# Patient Record
Sex: Female | Born: 1935 | Race: White | Hispanic: No | Smoking: Never smoker
Health system: Southern US, Community
[De-identification: ages and names within clinical notes are randomized; demographics above are authoritative.]

## PROBLEM LIST (undated history)

## (undated) DIAGNOSIS — I639 Cerebral infarction, unspecified: Secondary | ICD-10-CM

## (undated) DIAGNOSIS — G510 Bell's palsy: Secondary | ICD-10-CM

## (undated) DIAGNOSIS — I10 Essential (primary) hypertension: Secondary | ICD-10-CM

## (undated) DIAGNOSIS — E7800 Pure hypercholesterolemia, unspecified: Secondary | ICD-10-CM

## (undated) HISTORY — PX: CHOLECYSTECTOMY: SHX55

---

## 2012-07-25 ENCOUNTER — Ambulatory Visit (INDEPENDENT_AMBULATORY_CARE_PROVIDER_SITE_OTHER): Payer: PRIVATE HEALTH INSURANCE | Admitting: Emergency Medicine

## 2012-07-25 ENCOUNTER — Ambulatory Visit: Payer: Self-pay

## 2012-07-25 VITALS — BP 118/76 | HR 68 | Temp 98.7°F | Resp 16 | Ht 60.0 in | Wt 129.8 lb

## 2012-07-25 DIAGNOSIS — R05 Cough: Secondary | ICD-10-CM

## 2012-07-25 DIAGNOSIS — R059 Cough, unspecified: Secondary | ICD-10-CM

## 2012-07-25 DIAGNOSIS — J209 Acute bronchitis, unspecified: Secondary | ICD-10-CM

## 2012-07-25 MED ORDER — LEVOFLOXACIN 500 MG PO TABS: 500.0000 mg | ORAL_TABLET | Freq: Every day | ORAL | Status: AC

## 2012-07-25 MED ORDER — ALBUTEROL SULFATE HFA 108 (90 BASE) MCG/ACT IN AERS: 2.0000 | INHALATION_SPRAY | RESPIRATORY_TRACT | Status: AC | PRN

## 2012-07-25 MED ORDER — SPACER/AERO CHAMBER MOUTHPIECE MISC: 1.0000 | Status: AC

## 2012-07-25 MED ORDER — HYDROCOD POLST-CHLORPHEN POLST 10-8 MG/5ML PO LQCR
5.0000 mL | Freq: Two times a day (BID) | ORAL | Status: AC | PRN
Start: 2012-07-25 — End: ?

## 2012-07-25 NOTE — Patient Instructions (Addendum)
Bronquite  (Bronchitis)  A bronquite é uma inflamação (a forma que o corpo tem de reagir a um dano e/ou uma infecção) dos brônquios. Os brônquios são os tubos de ar que levam ar aos pulmões. Se a inflamação se tornar séria, pode causar falta de ar.  CAUSAS  A inflamação pode ser causada por:  · Vírus.  · Germes (bactérias).  · Poeira.  · Alérgenos.  · Poluentes e vários outros fatores irritantes.  As células que revestem a árvore brônquica são cobertas de cílios. Esses cílios se movimentam constantemente dos pulmões em direção à boca. Isso mantêm o pulmão livre de poluição. Quando essas células se irritam e não podem desempenhar sua função, começa a secreção de muco. Isso causa a tosse que caracteriza a bronquite. A tosse limpa os pulmões quando os cílios não podem realizar sua função. Sem estes mecanismos de defesa, o material ficaria alojado nos pulmões desenvolvendo, dessa maneira, pneumonia.   O tabagismo é uma causa comum da bronquite e pode contribuir à aparição de pneumonia. Parar de fumar é uma das maneiras mais importantes de autoajuda.  TRATAMENTO  · Seu médico pode receitar um antibiótico, se sua tosse for causada por bactéria, e medicamentos que abrirão as vias respiratórias para facilitar a respiração. Ele também pode recomendar ou receitar algum expectorante que liberará o catarro por meio da tosse. Tome somente os medicamentos vendidos com e sem receita médica para dores, desconforto ou febre segundo as orientações de seu médico.  · Eliminar a provável causa do problema (tabagismo, por exemplo) é essencial para prevenir sua complicação.  · Os supressores de tosse podem ser prescritos para aliviar os sintomas da tosse.  · Medicamentos por inalação podem ser também prescritos para ajudar a aliviar os sintomas e evitar a reaparição dos problemas.  · Para aquelas pessoas que sofrem de bronquite crônica, talvez seja necessário o uso de medicamentos que contêm esteróides.  PROCURE UM MÉDICO IMEDIATAMENTE  SE:  · Aparecer, durante o tratamento, escarros purulentos.  · Tiver febre.  · Seu filho tiver mais de três meses e apresentar temperatura retal de 38,9° C (102° F) ou superior.  · Seu filho tiver três meses, ou menos, e apresentar temperatura retal de 38° C (100,4° F) ou superior.  · A doença piorar gradativamente.  · Sentir dificuldade crescente para respirar, chiado no peito ou falta de ar.  É necessário buscar ajuda médica imediata se você for idoso ou sofrer de alguma outra doença.  ASSEGURE-SE DE QUE:   · Entendeu essas instruções.  · Controlará sua condição.  · Buscará ajuda imediata caso não se sinta bem ou seu estado piore.  Document Released: 07/15/2005 Document Revised: 10/07/2011  ExitCare® Patient Information ©2013 [employer].

## 2012-07-25 NOTE — Progress Notes (Signed)
[facility] [address] [place] [address] [phone]  Date:  07/25/2012   Name:  Anita Jefferson   DOB:  March 06, 1935   MRN:  [id number]  PCP:  No primary provider on file.    Chief Complaint: Cough   History of Present Illness:  Anita Jefferson is a 77 y.o. very pleasant female patient who presents with the following:  Ill for over two weeks with a productive cough with purulent sputum.  Some episodic shortness of breath.  No wheezing other than occasionally this morning.  No fever or chills.  Poor appetite. Had flu shot.  Purulent nasal drainage and post nasal drip. Very congested. Worse when lays down.  No nausea or vomiting.  Improved transiently then worsened.  Grandchildren ill at home.  No history of asthma or MDI use  There is no problem list on file for this patient.   No past medical history on file.  No past surgical history on file.  History  Substance Use Topics  . Smoking status: Never Smoker   . Smokeless tobacco: Not on file  . Alcohol Use: Not on file    No family history on file.  No Known Allergies  Medication list has been reviewed and updated.  Current Outpatient Prescriptions on File Prior to Visit  Medication Sig Dispense Refill  . amLODipine (NORVASC) 10 MG tablet Take 10 mg by mouth daily.      Marland Kitchen atenolol (TENORMIN) 100 MG tablet Take 100 mg by mouth daily.      Marland Kitchen atorvastatin (LIPITOR) 20 MG tablet Take 20 mg by mouth daily.      Marland Kitchen levothyroxine (SYNTHROID, LEVOTHROID) 50 MCG tablet Take 50 mcg by mouth daily.        Review of Systems:  As per HPI, otherwise negative.    Physical Examination: Filed Vitals:   07/25/12 1342  BP: 118/76  Pulse: 68  Temp: 98.7 F (37.1 C)  Resp: 16   Filed Vitals:   07/25/12 1342  Height: 5' (1.524 m)  Weight: 129 lb 12.8 oz (58.877 kg)   Body mass index is 25.35 kg/(m^2). Ideal Body Weight: Weight in (lb) to have BMI = 25: 127.7   GEN: WDWN, NAD, Non-toxic, A & O x 3   No rash or shortness of breath HEENT: Atraumatic, Normocephalic. Neck supple. No masses, No LAD.  Oropharynx negative Ears and Nose: No external deformity.  TM negative.  Purulent nasal drainage CV: RRR, No M/G/R. No JVD. No thrill. No extra heart sounds. PULM: CTA B, diffuse wheezes more in lower chest, crackles, rhonchi. No retractions. No resp. distress. No accessory muscle use. ABD: S, NT, ND, +BS. No rebound. No HSM. EXTR: No c/c/e NEURO Normal gait.  PSYCH: Normally interactive. Conversant. Not depressed or anxious appearing.  Calm demeanor.    Assessment and Plan: Bronchitis Bronchospasm MDI albuterol levaquin tussionex  Carmelina Dane, MD

## 2012-07-25 NOTE — Progress Notes (Signed)
  Subjective:    Patient ID: Anita Jefferson, female    DOB: 11/22/1934, 77 y.o.   MRN: [id number]  HPI    Review of Systems     Objective:   Physical Exam        Assessment & Plan:    [facility] reading (PRIMARY) by  Dr. Dareen Piano.  Negative chest .

## 2020-02-27 ENCOUNTER — Emergency Department (HOSPITAL_COMMUNITY)
Admission: EM | Admit: 2020-02-27 | Discharge: 2020-02-28 | Disposition: A | Discharge status: Home / Self Care | Payer: Medicare (Managed Care) | Attending: Emergency Medicine | Admitting: Emergency Medicine

## 2020-02-27 ENCOUNTER — Other Ambulatory Visit: Payer: Self-pay

## 2020-02-27 DIAGNOSIS — R918 Other nonspecific abnormal finding of lung field: Secondary | ICD-10-CM | POA: Insufficient documentation

## 2020-02-27 DIAGNOSIS — Z5321 Procedure and treatment not carried out due to patient leaving prior to being seen by health care provider: Secondary | ICD-10-CM | POA: Insufficient documentation

## 2020-02-27 NOTE — ED Triage Notes (Signed)
Arrived by POV from urgent care. Patient's daughter reports patient was sent for  FT scan for further evaluation of "mass" that was seen on chest Xray.

## 2020-02-28 ENCOUNTER — Other Ambulatory Visit: Payer: Self-pay

## 2020-02-28 ENCOUNTER — Emergency Department (HOSPITAL_BASED_OUTPATIENT_CLINIC_OR_DEPARTMENT_OTHER): Payer: Medicare (Managed Care)

## 2020-02-28 ENCOUNTER — Emergency Department (HOSPITAL_BASED_OUTPATIENT_CLINIC_OR_DEPARTMENT_OTHER)
Admission: EM | Admit: 2020-02-28 | Discharge: 2020-02-28 | Disposition: A | Discharge status: Home / Self Care | Payer: Medicare (Managed Care) | Source: Home / Self Care | Attending: Emergency Medicine | Admitting: Emergency Medicine

## 2020-02-28 ENCOUNTER — Encounter (HOSPITAL_BASED_OUTPATIENT_CLINIC_OR_DEPARTMENT_OTHER): Payer: Self-pay

## 2020-02-28 DIAGNOSIS — I10 Essential (primary) hypertension: Secondary | ICD-10-CM | POA: Insufficient documentation

## 2020-02-28 DIAGNOSIS — J189 Pneumonia, unspecified organism: Secondary | ICD-10-CM | POA: Insufficient documentation

## 2020-02-28 DIAGNOSIS — Z8673 Personal history of transient ischemic attack (TIA), and cerebral infarction without residual deficits: Secondary | ICD-10-CM | POA: Insufficient documentation

## 2020-02-28 DIAGNOSIS — Z20822 Contact with and (suspected) exposure to covid-19: Secondary | ICD-10-CM | POA: Insufficient documentation

## 2020-02-28 DIAGNOSIS — Z79899 Other long term (current) drug therapy: Secondary | ICD-10-CM | POA: Insufficient documentation

## 2020-02-28 HISTORY — DX: Cerebral infarction, unspecified: I63.9

## 2020-02-28 HISTORY — DX: Bell's palsy: G51.0

## 2020-02-28 HISTORY — DX: Pure hypercholesterolemia, unspecified: E78.00

## 2020-02-28 HISTORY — DX: Essential (primary) hypertension: I10

## 2020-02-28 LAB — COMPREHENSIVE METABOLIC PANEL
ALT: 27 U/L (ref 0–44)
AST: 25 U/L (ref 15–41)
Albumin: 4 g/dL (ref 3.5–5.0)
Alkaline Phosphatase: 80 U/L (ref 38–126)
Anion gap: 11 (ref 5–15)
BUN: 29 mg/dL — ABNORMAL HIGH (ref 8–23)
CO2: 25 mmol/L (ref 22–32)
Calcium: 10.1 mg/dL (ref 8.9–10.3)
Chloride: 104 mmol/L (ref 98–111)
Creatinine, Ser: 1.19 mg/dL — ABNORMAL HIGH (ref 0.44–1.00)
GFR calc Af Amer: 49 mL/min — ABNORMAL LOW (ref >60)
GFR calc non Af Amer: 42 mL/min — ABNORMAL LOW (ref >60)
Glucose, Bld: 97 mg/dL (ref 70–99)
Potassium: 4 mmol/L (ref 3.5–5.1)
Sodium: 140 mmol/L (ref 135–145)
Total Bilirubin: 0.6 mg/dL (ref 0.3–1.2)
Total Protein: 8.7 g/dL — ABNORMAL HIGH (ref 6.5–8.1)

## 2020-02-28 LAB — URINALYSIS, MICROSCOPIC (REFLEX)

## 2020-02-28 LAB — CBC WITH DIFFERENTIAL/PLATELET
Abs Immature Granulocytes: 0.04 10*3/uL (ref 0.00–0.07)
Basophils Absolute: 0 10*3/uL (ref 0.0–0.1)
Basophils Relative: 0 %
Eosinophils Absolute: 0.2 10*3/uL (ref 0.0–0.5)
Eosinophils Relative: 3 %
HCT: 34 % — ABNORMAL LOW (ref 36.0–46.0)
Hemoglobin: 11.3 g/dL — ABNORMAL LOW (ref 12.0–15.0)
Immature Granulocytes: 1 %
Lymphocytes Relative: 29 %
Lymphs Abs: 1.6 10*3/uL (ref 0.7–4.0)
MCH: 31.2 pg (ref 26.0–34.0)
MCHC: 33.2 g/dL (ref 30.0–36.0)
MCV: 93.9 fL (ref 80.0–100.0)
Monocytes Absolute: 0.4 10*3/uL (ref 0.1–1.0)
Monocytes Relative: 8 %
Neutro Abs: 3.2 10*3/uL (ref 1.7–7.7)
Neutrophils Relative %: 59 %
Platelets: 118 10*3/uL — ABNORMAL LOW (ref 150–400)
RBC: 3.62 MIL/uL — ABNORMAL LOW (ref 3.87–5.11)
RDW: 13.1 % (ref 11.5–15.5)
Smear Review: ADEQUATE
WBC: 5.4 10*3/uL (ref 4.0–10.5)
nRBC: 0 % (ref 0.0–0.2)

## 2020-02-28 LAB — URINALYSIS, ROUTINE W REFLEX MICROSCOPIC
Bilirubin Urine: NEGATIVE
Glucose, UA: NEGATIVE mg/dL
Ketones, ur: NEGATIVE mg/dL
Leukocytes,Ua: NEGATIVE
Nitrite: NEGATIVE
Protein, ur: NEGATIVE mg/dL
Specific Gravity, Urine: 1.01 (ref 1.005–1.030)
pH: 6.5 (ref 5.0–8.0)

## 2020-02-28 LAB — SARS CORONAVIRUS 2 BY RT PCR (HOSPITAL ORDER, PERFORMED IN ~~LOC~~ HOSPITAL LAB): SARS Coronavirus 2: NEGATIVE

## 2020-02-28 MED ORDER — AZITHROMYCIN 250 MG PO TABS: 250.0000 mg | ORAL_TABLET | Freq: Every day | ORAL | 0 refills | Status: AC

## 2020-02-28 MED ORDER — AMOXICILLIN-POT CLAVULANATE 875-125 MG PO TABS: 1.0000 | ORAL_TABLET | Freq: Two times a day (BID) | ORAL | 0 refills | Status: AC

## 2020-02-28 MED ORDER — IOHEXOL 300 MG/ML  SOLN
80.0000 mL | Freq: Once | INTRAMUSCULAR | Status: AC | PRN
  Administered 2020-02-28: 80 mL via INTRAVENOUS

## 2020-02-28 NOTE — ED Triage Notes (Addendum)
Per pt's daughter/interpreter-pt with fever and upper back pain yesterday-was seen at [facility] and sent to ED for CT scan-[facility] ED after ~4hr wait-states she had a neg covid test at [facility]/has received covid vaccine-pt NAD-steady gait

## 2020-02-28 NOTE — ED Provider Notes (Signed)
[facility] EMERGENCY DEPARTMENT Provider Note   CSN: [id number] Arrival date & time: 02/28/20  1213     History Chief Complaint  Patient presents with  . Fever    Anita Jefferson is a 85 y.o. female.  Anita Jefferson is a 85 y.o. female with a history of hypertension, hyperlipidemia, stroke, and Bell's palsy, who presents to the emergency department for evaluation of abnormal chest x-ray. Patient speaks [place], she is accompanied by her daughter who translates and helps to provide history. Patient woke up yesterday morning feeling unwell, weak, and had low-grade temperature of 99.6. Her daughter took her to urgent care where they performed a chest x-ray that showed an area concerning for possible mass versus pneumonia, they did a Covid test at urgent care which was negative and patient has had her Covid vaccine. She has had an occasional productive cough, denies chest pain or shortness of breath, states she is just felt a bit malaised, overall today she is feeling improved has not taken anything specific for symptomatic treatment. Yesterday they tried to go to [facility] emergency department as recommended by urgent care for a CT scan of the chest but after a 4-hour wait they left without being seen. She has had occasional cough today but denies any chest pain or shortness of breath. Denies abdominal pain, nausea vomiting or diarrhea. No night sweats or unexpected weight loss. No dysuria or urinary frequency. They were not prescribed any medications from urgent care. No known history of malignancy. No other aggravating or alleviating factors.        Past Medical History:  Diagnosis Date  . Bell's palsy   . High cholesterol   . Hypertension   . Stroke [facility])     There are no problems to display for this patient.   Past Surgical History:  Procedure Laterality Date  . CESAREAN SECTION    . CHOLECYSTECTOMY       OB History   No obstetric history on file.     No family  history on file.  Social History   Tobacco Use  . Smoking status: Never Smoker  . Smokeless tobacco: Never Used  Vaping Use  . Vaping Use: Never used  Substance Use Topics  . Alcohol use: Never  . Drug use: Never    Home Medications Prior to Admission medications   Medication Sig Start Date End Date Taking? Authorizing Provider  albuterol (PROVENTIL HFA;VENTOLIN HFA) 108 (90 BASE) MCG/ACT inhaler Inhale 2 puffs into the lungs every 4 (four) hours as needed for wheezing (cough, shortness of breath or wheezing.). 07/25/12   Carmelina Dane, MD  ALPRAZolam Prudy Feeler) 1 MG tablet Take 1 mg by mouth at bedtime as needed.    [provider]  amLODipine (NORVASC) 10 MG tablet Take 10 mg by mouth daily.    [provider]  amoxicillin-clavulanate (AUGMENTIN) 875-125 MG tablet Take 1 tablet by mouth 2 (two) times daily. One po bid x 7 days 02/28/20   Dartha Lodge, PA-C  atenolol (TENORMIN) 100 MG tablet Take 100 mg by mouth daily.    [provider]  atorvastatin (LIPITOR) 20 MG tablet Take 20 mg by mouth daily.    [provider]  azithromycin (ZITHROMAX) 250 MG tablet Take 1 tablet (250 mg total) by mouth daily. Take first 2 tablets together, then 1 every day until finished. 02/28/20   Dartha Lodge, PA-C  chlorpheniramine-HYDROcodone (TUSSIONEX PENNKINETIC ER) 10-8 MG/5ML LQCR Take 5 mLs by mouth  every 12 (twelve) hours as needed (cough). 07/25/12   Carmelina Dane, MD  levothyroxine (SYNTHROID, LEVOTHROID) 50 MCG tablet Take 50 mcg by mouth daily.    [provider]  Spacer/Aero Chamber Mouthpiece MISC 1 each by Does not apply route as directed. 07/25/12   Carmelina Dane, MD    Allergies    Patient has no known allergies.  Review of Systems   Review of Systems  Constitutional: Positive for chills and fatigue. Negative for fever.  HENT: Positive for congestion. Negative for rhinorrhea.   Respiratory: Positive for cough. Negative for  shortness of breath.   Cardiovascular: Negative for chest pain.  Gastrointestinal: Negative for abdominal pain, nausea and vomiting.  Genitourinary: Negative for dysuria and frequency.  Musculoskeletal: Negative for arthralgias and myalgias.  Skin: Negative for color change and rash.  Neurological: Positive for weakness (generalized). Negative for dizziness, syncope and light-headedness.  All other systems reviewed and are negative.   Physical Exam Updated Vital Signs BP (!) 142/70 (BP Location: Left Arm)   Pulse 65   Temp 98.2 F (36.8 C) (Oral)   Resp 16   Ht 4\' 9"  (1.448 m)   Wt 60.3 kg   SpO2 100%   BMI 28.78 kg/m   Physical Exam Vitals and nursing note reviewed.  Constitutional:      General: She is not in acute distress.    Appearance: She is well-developed and normal weight. She is not ill-appearing or diaphoretic.     Comments: Elderly female, alert, well-appearing and in no distress  HENT:     Head: Normocephalic and atraumatic.  Eyes:     General:        Right eye: No discharge.        Left eye: No discharge.  Cardiovascular:     Rate and Rhythm: Normal rate and regular rhythm.     Heart sounds: Normal heart sounds. No murmur heard.  No friction rub. No gallop.   Pulmonary:     Effort: Pulmonary effort is normal. No respiratory distress.     Breath sounds: Normal breath sounds. No wheezing or rales.     Comments: Respirations equal and unlabored, patient able to speak in full sentences, some faint rhonchi noted in the right lower lung, but lungs otherwise clear to auscultation Abdominal:     General: Bowel sounds are normal. There is no distension.     Palpations: Abdomen is soft. There is no mass.     Tenderness: There is no abdominal tenderness. There is no guarding.     Comments: Abdomen soft, nondistended, nontender to palpation in all quadrants without guarding or peritoneal signs  Musculoskeletal:        General: No deformity.     Cervical back: Neck  supple.  Skin:    General: Skin is warm and dry.     Capillary Refill: Capillary refill takes less than 2 seconds.  Neurological:     Mental Status: She is alert and oriented to person, place, and time.     Coordination: Coordination normal.     Comments: Speech is clear, able to follow commands Moves extremities without ataxia, coordination intact  Psychiatric:        Mood and Affect: Mood normal.        Behavior: Behavior normal.     ED Results / Procedures / Treatments   Labs (all labs ordered are listed, but only abnormal results are displayed) Labs Reviewed  COMPREHENSIVE METABOLIC PANEL - Abnormal; Notable  for the following components:      Result Value   BUN 29 (*)    Creatinine, Ser 1.19 (*)    Total Protein 8.7 (*)    GFR calc non Af Amer 42 (*)    GFR calc Af Amer 49 (*)    All other components within normal limits  CBC WITH DIFFERENTIAL/PLATELET - Abnormal; Notable for the following components:   RBC 3.62 (*)    Hemoglobin 11.3 (*)    HCT 34.0 (*)    Platelets 118 (*)    All other components within normal limits  URINALYSIS, ROUTINE W REFLEX MICROSCOPIC - Abnormal; Notable for the following components:   Hgb urine dipstick TRACE (*)    All other components within normal limits  URINALYSIS, MICROSCOPIC (REFLEX) - Abnormal; Notable for the following components:   Bacteria, UA FEW (*)    All other components within normal limits  SARS CORONAVIRUS 2 BY RT PCR [facility])    EKG EKG Interpretation  Date/Time:  Monday February 28 2020 15:06:53 EDT Ventricular Rate:  62 PR Interval:    QRS Duration: 96 QT Interval:  422 QTC Calculation: 429 R Axis:   18 Text Interpretation: Sinus rhythm Abnormal R-wave progression, early transition Borderline T abnormalities, inferior leads No acute changes No old tracing to compare Confirmed by Derwood Kaplananavati, Ankit [id number]) on 02/29/2020 10:51:14 AM   Radiology CT Chest W  Contrast  Result Date: 02/28/2020 CLINICAL DATA:  Fever and upper back pain. EXAM: CT CHEST WITH CONTRAST TECHNIQUE: Multidetector CT imaging of the chest was performed during intravenous contrast administration. CONTRAST:  80mL OMNIPAQUE IOHEXOL 300 MG/ML  SOLN COMPARISON:  July 25, 2012 FINDINGS: Cardiovascular: Aneurysm of the ascending thoracic aorta 4.2 cm x 4.2 cm. Descending thoracic aorta measures 3.6 cm. Aortic valve calcifications. Three-vessel coronary artery atherosclerotic calcifications. The heart is enlarged. Trace pericardial effusion. No central pulmonary embolism; study was not timed to evaluate specifically for pulmonary embolism. Moderate to severe atherosclerotic calcifications throughout the course of the aorta. Mediastinum/Nodes: No enlarged mediastinal, hilar, or axillary lymph nodes. Thyroid gland is unremarkable. Lungs/Pleura: Scattered areas of endobronchial plugging and debris. Bibasilar atelectasis. Lingular centrilobular nodularity and ground-glass opacities. No pleural effusion or pneumothorax. Upper Abdomen: Indeterminate 1.5 cm LEFT-sided adrenal nodule. Subcentimeter hypodense lesion of the LEFT kidney is too small to accurately characterize. Multiple splenules. Small hiatal hernia. Partially visualized prominent common hepatic duct likely due to report cholecystectomy state. Musculoskeletal: Degenerative changes of the thoracic spine. No aggressive osseous lesion. IMPRESSION: 1. Lingular centrilobular nodularity and ground-glass opacities, likely infectious or inflammatory in etiology. 2. Indeterminate 1.5 cm LEFT adrenal nodule. Recommend nonemergent follow-up adrenal protocol CT or MRI versus comparison with any available remote outside priors. 3. Small hiatal hernia. 4. Ascending thoracic aortic aneurysm measuring up to 4.2 cm with aortic valve calcifications. This can be seen in the setting of aortic stenosis. Recommend annual imaging followup by CTA or MRA. This  recommendation follows 2010 ACCF/AHA/AATS/ACR/ASA/SCA/SCAI/SIR/STS/SVM Guidelines for the Diagnosis and Management of Patients with Thoracic Aortic Disease. Circulation. 2010; 121: U045-W098: E266-e369. Aortic aneurysm NOS (ICD10-I71.9) 5. No central pulmonary embolism. Aortic Atherosclerosis (ICD10-I70.0). Electronically Signed   By: Meda KlinefelterStephanie  Peacock MD   On: 02/28/2020 16:28    Procedures Procedures (including critical care time)  Medications Ordered in ED Medications  iohexol (OMNIPAQUE) 300 MG/ML solution 80 mL (80 mLs Intravenous Contrast Given 02/28/20 1601)    ED Course  I have reviewed the triage vital signs  and the nursing notes.  Pertinent labs & imaging results that were available during my care of the patient were reviewed by me and considered in my medical decision making (see chart for details).    MDM Rules/Calculators/A&P                          85 year old female sent from urgent care for evaluation of abnormal chest x-ray done yesterday which was concerning for potential pneumonia versus mass.  She was feeling unwell yesterday with low-grade fever and general malaise, has had an occasional cough, no chest pain or shortness of breath.  Today she is feeling improved, on arrival she is afebrile with normal vitals.  On exam she does have some faint rhonchi noted in the right lower lobe of the lung.  She has had her Covid vaccine and had a negative test to urgent care yesterday, will retest today with PCR and get basic lab work, urinalysis, EKG and chest CT to better assess abnormalities noted on chest x-ray yesterday, unable to review x-ray from urgent care.  I have independently ordered, reviewed and interpreted all labs and imaging: CBC: no leukocytosis, normal hemoglobin CMP: No significant electrolyte derangements, mild elevation creatinine at 1.19, no previous available for comparison UA: no evidence of infection EKG with sinus rhythm, some nonspecific T wave changes, no prior  available for comparison, patient without any chest pain  CT of the chest with lingular central lobar nodularity and groundglass opacities, this is likely infectious or inflammatory in etiology, indeterminate 1.5 cm left adrenal nodule, recommended nonemergent follow-up with PCP.  Small hiatal hernia noted.  Ascending thoracic aortic aneurysm measuring 4.2 cm, will have patient follow-up with PCP regarding this.  We will treat for lingular pneumonia with Augmentin and azithromycin and have patient follow-up closely with her primary care doctor.  Covid test today was negative.  Patient has normal oxygen with no increased respiratory effort, do not think she will require admission for treatment of this pneumonia.  Return precautions discussed and patient discharged home in good condition.  Final Clinical Impression(s) / ED Diagnoses Final diagnoses:  Lingular pneumonia    Rx / DC Orders ED Discharge Orders         Ordered    amoxicillin-clavulanate (AUGMENTIN) 875-125 MG tablet  2 times daily     Discontinue  Reprint     02/28/20 1753    azithromycin (ZITHROMAX) 250 MG tablet  Daily     Discontinue  Reprint     02/28/20 1753           Dartha Lodge, PA-C 03/01/20 1029    Vanetta Mulders, MD 03/06/20 [phone]

## 2020-02-28 NOTE — Discharge Instructions (Signed)
Your CT scan shows a pneumonia, but we do not see signs of a mass, your lab work and vitals otherwise look good today, take both antibiotics as prescribed for the next 5 days and then follow-up closely with your primary care doctor.  If you develop worsening shortness of breath, chest pain, cough, fevers or any other new or concerning symptoms return to the ED for reevaluation.

## 2020-02-28 NOTE — ED Notes (Signed)
PT aware of need for UA 

## 2021-10-14 IMAGING — CT CT CHEST W/ CM
2 of 4 series · 14 of 36 positions shown, 17 images · IV contrast (Omnipaque)
Comparison: July 25, 2012

CLINICAL DATA: Fever and upper back pain.

EXAM:
CT CHEST WITH CONTRAST
TECHNIQUE: Multidetector CT imaging of the chest was performed during
intravenous contrast administration.
CONTRAST:  80mL OMNIPAQUE IOHEXOL 300 MG/ML  SOLN

[Series 2: axial st · axial · 0.56mm/px · z∈[-140,+66]mm · 11 of 119 slices shown, 14 images]
[im 8/119  mediastinal]
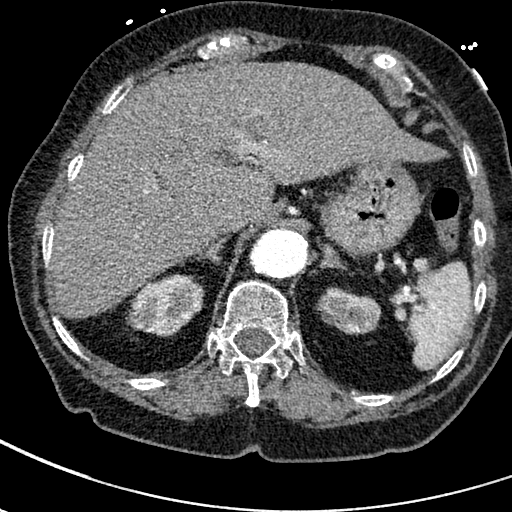
[im 8/119  lung]
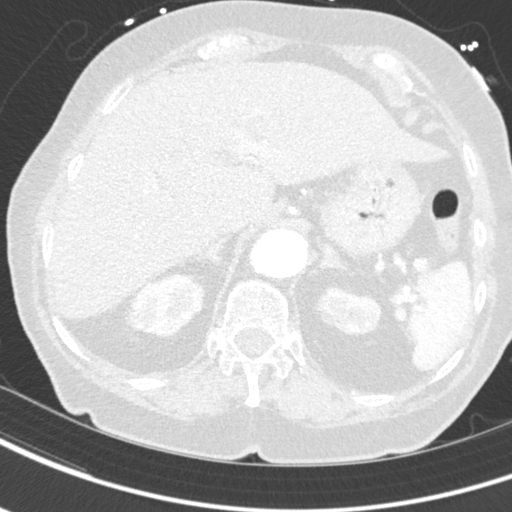
[im 16/119  lung]
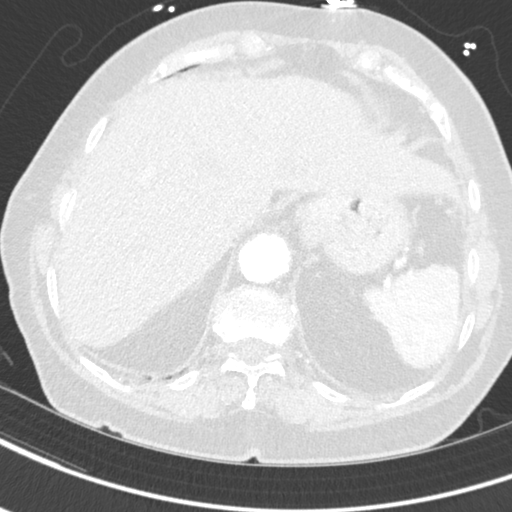
[im 32/119  lung]
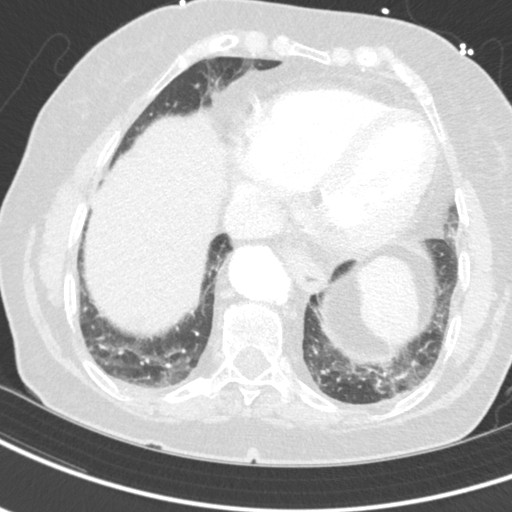
[im 40/119  lung]
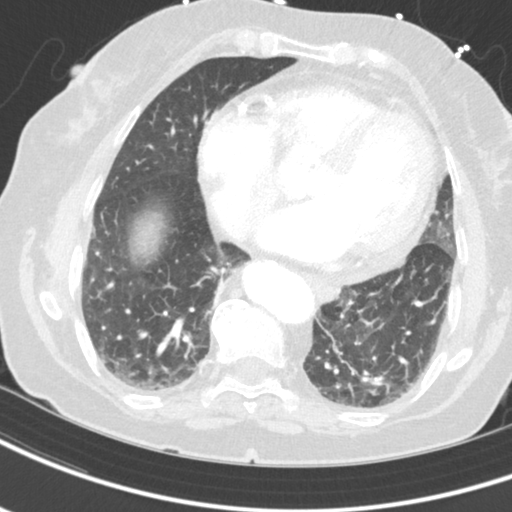
[im 48/119  mediastinal]
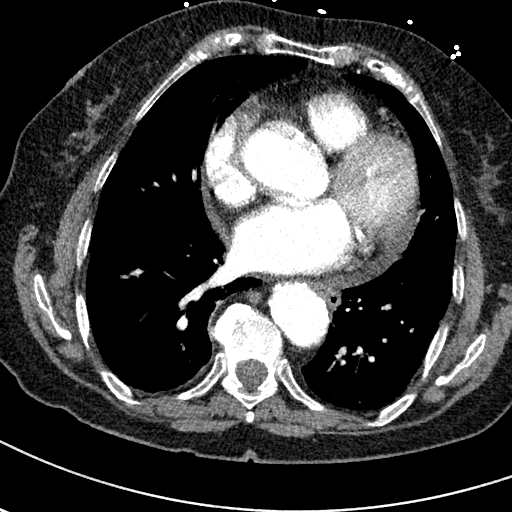
[im 48/119  lung]
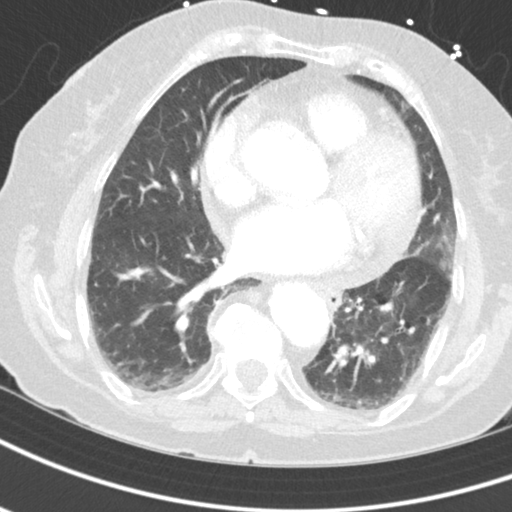
[im 63/119  lung]
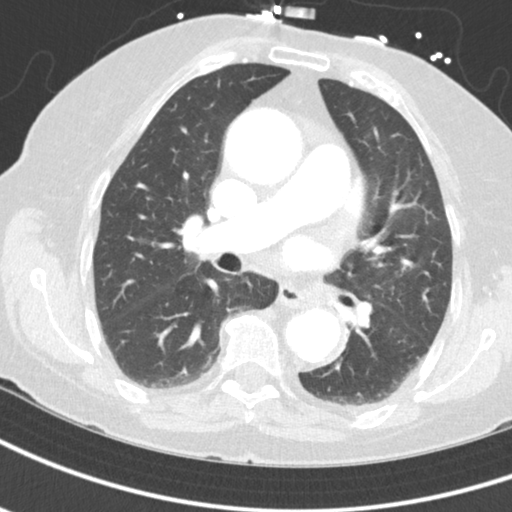
[im 71/119  lung]
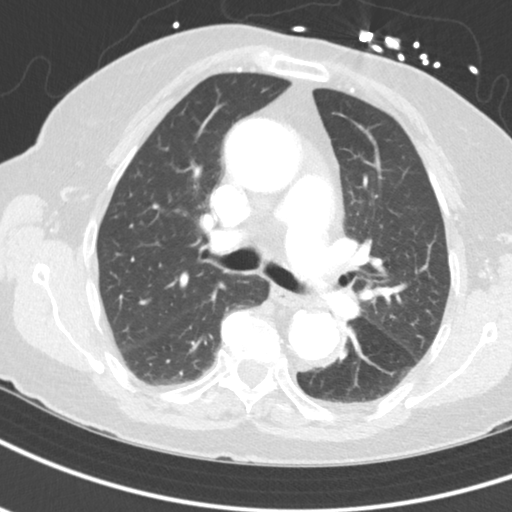
[im 79/119  lung]
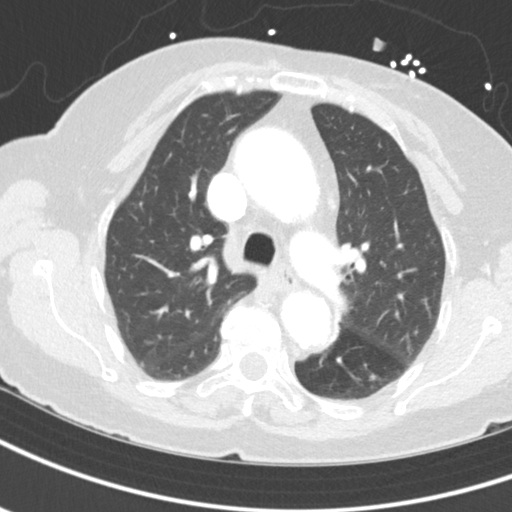
[im 87/119  mediastinal]
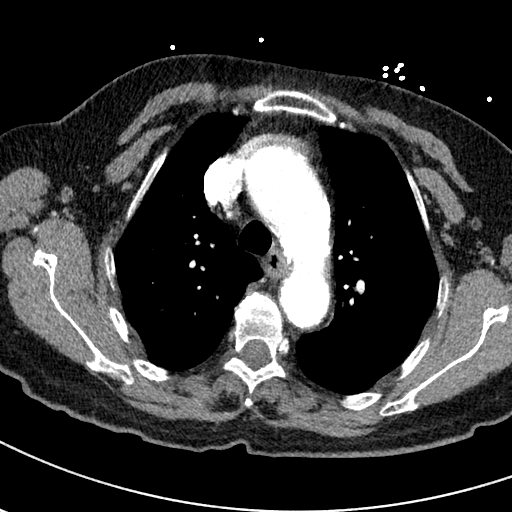
[im 87/119  lung]
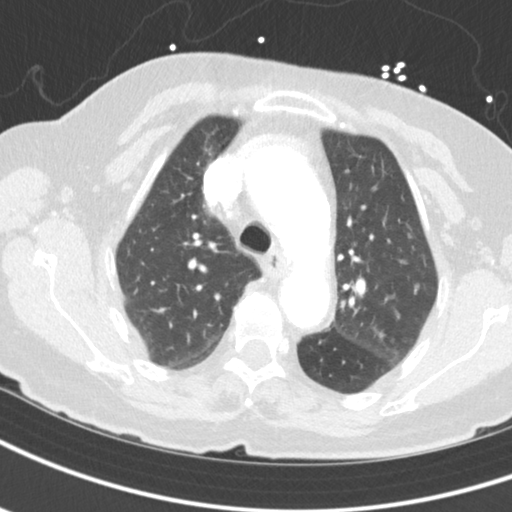
[im 103/119  lung]
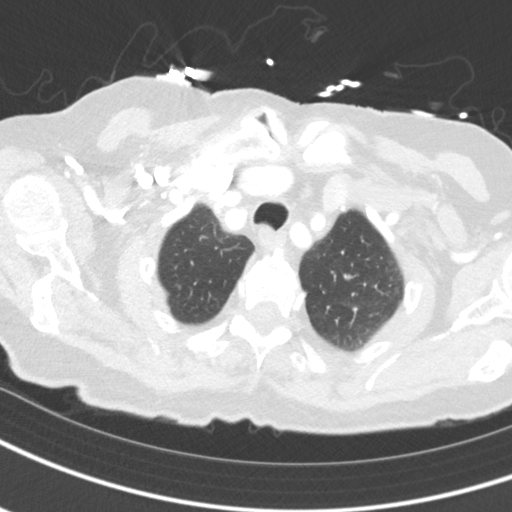
[im 111/119  lung]
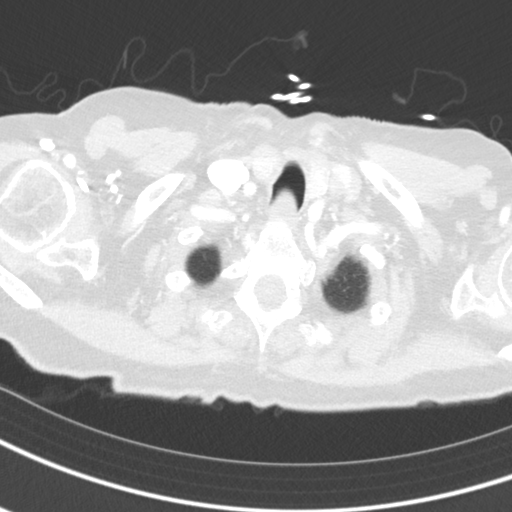

[Series 5: coronal · coronal · 0.49mm/px · 3 of 127 slices shown]
[im 26/127  lung]
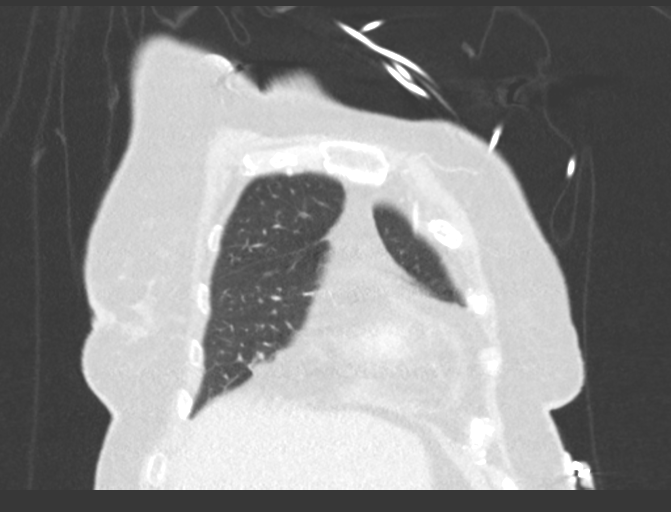
[im 51/127  lung]
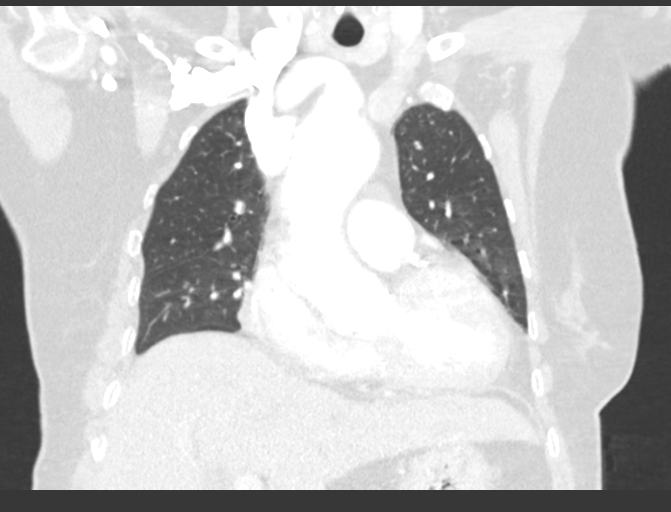
[im 76/127  lung]
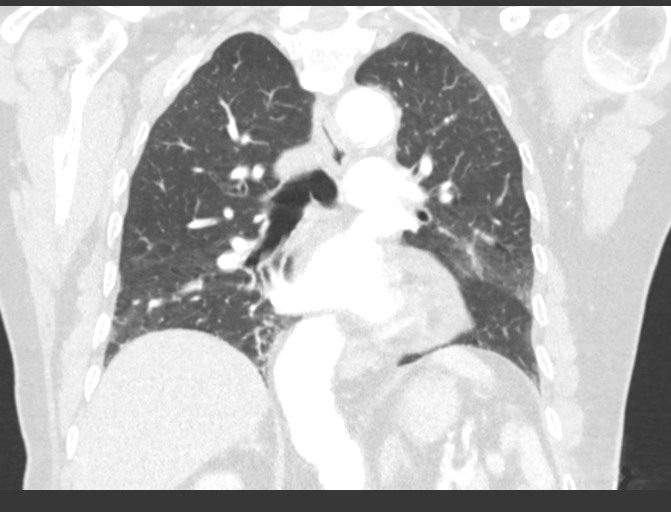

[14 of 36 positions shown; findings below may reference images not displayed]

FINDINGS: Cardiovascular: Aneurysm of the ascending thoracic aorta 4.2 cm x
4.2 cm. Descending thoracic aorta measures 3.6 cm. Aortic valve
calcifications. Three-vessel coronary artery atherosclerotic
calcifications. The heart is enlarged. Trace pericardial effusion.
No central pulmonary embolism; study was not timed to evaluate
specifically for pulmonary embolism. Moderate to severe
atherosclerotic calcifications throughout the course of the aorta.

Mediastinum/Nodes: No enlarged mediastinal, hilar, or axillary lymph
nodes. Thyroid gland is unremarkable.

Lungs/Pleura: Scattered areas of endobronchial plugging and debris.
Bibasilar atelectasis. Lingular centrilobular nodularity and
ground-glass opacities. No pleural effusion or pneumothorax.

Upper Abdomen: Indeterminate 1.5 cm LEFT-sided adrenal nodule.
Subcentimeter hypodense lesion of the LEFT kidney is too small to
accurately characterize. Multiple splenules. Small hiatal hernia.
Partially visualized prominent common hepatic duct likely due to
report cholecystectomy state.

Musculoskeletal: Degenerative changes of the thoracic spine. No
aggressive osseous lesion.
IMPRESSION: 1. Lingular centrilobular nodularity and ground-glass opacities,
likely infectious or inflammatory in etiology.
2. Indeterminate 1.5 cm LEFT adrenal nodule. Recommend nonemergent
follow-up adrenal protocol CT or MRI versus comparison with any
available remote outside priors.
3. Small hiatal hernia.
4. Ascending thoracic aortic aneurysm measuring up to 4.2 cm with
aortic valve calcifications. This can be seen in the setting of
aortic stenosis. Recommend annual imaging followup by CTA or MRA.
This recommendation follows 2555
ACCF/AHA/AATS/ACR/ASA/SCA/FILIMONOVAS/MIKMIK/KARL-ANTON/ERIK Guidelines for the
Diagnosis and Management of Patients with Thoracic Aortic Disease.
Circulation. 2555; 121: E266-e369. Aortic aneurysm NOS (31DSW-K52.D)
5. No central pulmonary embolism.

Aortic Atherosclerosis (31DSW-I31.1).
